# Patient Record
Sex: Female | Born: 1990 | Race: White | Hispanic: No | Marital: Single | Smoking: Former smoker
Health system: Southern US, Community
[De-identification: ages and names within clinical notes are randomized; demographics above are authoritative.]

## PROBLEM LIST (undated history)

## (undated) DIAGNOSIS — F32A Depression, unspecified: Secondary | ICD-10-CM

## (undated) DIAGNOSIS — F329 Major depressive disorder, single episode, unspecified: Secondary | ICD-10-CM

## (undated) HISTORY — DX: Depression, unspecified: F32.A

## (undated) HISTORY — DX: Major depressive disorder, single episode, unspecified: F32.9

---
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
## 2011-01-12 ENCOUNTER — Emergency Department: Payer: Self-pay | Admitting: Emergency Medicine

## 2011-10-27 ENCOUNTER — Ambulatory Visit: Payer: Self-pay | Admitting: Family Medicine

## 2012-03-09 ENCOUNTER — Emergency Department: Payer: Self-pay | Admitting: Emergency Medicine

## 2013-03-01 ENCOUNTER — Emergency Department: Payer: Self-pay | Admitting: Emergency Medicine

## 2013-03-01 LAB — BASIC METABOLIC PANEL
Anion Gap: 9 (ref 7–16)
BUN: 9 mg/dL (ref 7–18)
Chloride: 105 mmol/L (ref 98–107)
Creatinine: 0.49 mg/dL — ABNORMAL LOW (ref 0.60–1.30)
EGFR (African American): >60
EGFR (Non-African Amer.): >60
Glucose: 95 mg/dL (ref 65–99)
Potassium: 3.6 mmol/L (ref 3.5–5.1)

## 2013-03-01 LAB — CBC
HCT: 33.2 % — ABNORMAL LOW (ref 35.0–47.0)
HGB: 11.3 g/dL — ABNORMAL LOW (ref 12.0–16.0)
MCH: 30.6 pg (ref 26.0–34.0)
MCHC: 34 g/dL (ref 32.0–36.0)
MCV: 90 fL (ref 80–100)
RBC: 3.7 10*6/uL — ABNORMAL LOW (ref 3.80–5.20)

## 2013-03-02 LAB — URINALYSIS, COMPLETE
Bilirubin,UR: NEGATIVE
Glucose,UR: NEGATIVE mg/dL (ref 0–75)
Nitrite: NEGATIVE
Ph: 5 (ref 4.5–8.0)
Protein: NEGATIVE
RBC,UR: 2 /HPF (ref 0–5)

## 2013-04-08 ENCOUNTER — Ambulatory Visit: Payer: Self-pay | Admitting: Family Medicine

## 2013-05-09 ENCOUNTER — Encounter: Payer: Self-pay | Admitting: Maternal & Fetal Medicine

## 2013-08-22 ENCOUNTER — Observation Stay: Payer: Self-pay

## 2013-08-22 ENCOUNTER — Inpatient Hospital Stay: Payer: Self-pay

## 2013-08-22 LAB — CBC WITH DIFFERENTIAL/PLATELET
Basophil #: 0.1 10*3/uL (ref 0.0–0.1)
Basophil %: 0.4 %
Eosinophil #: 0.1 10*3/uL (ref 0.0–0.7)
Eosinophil %: 0.4 %
HCT: 35 % (ref 35.0–47.0)
Lymphocyte #: 1.3 10*3/uL (ref 1.0–3.6)
MCV: 94 fL (ref 80–100)
Monocyte #: 1.1 x10 3/mm — ABNORMAL HIGH (ref 0.2–0.9)
Monocyte %: 7.5 %
Neutrophil %: 82.4 %
Platelet: 164 10*3/uL (ref 150–440)
RBC: 3.72 10*6/uL — ABNORMAL LOW (ref 3.80–5.20)
RDW: 13.8 % (ref 11.5–14.5)

## 2013-08-24 LAB — HEMATOCRIT: HCT: 32.3 % — ABNORMAL LOW (ref 35.0–47.0)

## 2013-08-24 LAB — GC/CHLAMYDIA PROBE AMP

## 2015-04-07 NOTE — H&P (Signed)
L&D Evaluation:  History:  HPI Pt is a 24 yo G1P0 at 41 weeks who presents to the hospital today with reports of ctx. Pt reports +FM, denies vb, lof. She is O+, RNI, VNI, GBS negative. She had her TDaP during this pregnancy. Her cervical exam was 1-2/70 upon admission.   Presents with contractions   Patient's Medical History anemia   Patient's Surgical History none   Medications Pre Natal Vitamins  Iron   Allergies NKDA   Family History Non-Contributory   ROS:  ROS All systems were reviewed.  HEENT, CNS, GI, GU, Respiratory, CV, Renal and Musculoskeletal systems were found to be normal.   Exam:  Vital Signs stable   General no apparent distress   Mental Status clear   Chest clear   Heart normal sinus rhythm   Abdomen gravid, tender with contractions   Back no CVAT   Edema no edema   Mebranes Intact   FHT normal rate with no decels   Ucx irregular   Skin dry, no lesions   Other Cervical exam at 920am 2/70. Pt has not changed since admission.   Impression:  Impression reactive NST, possible early labor, post dates   Plan:  Plan discharge, pt scheduled for induction tonight. Pt sent home with therapeutic rest and instructions of when to come back to the hospital.   Follow Up Appointment need to schedule   Electronic Signatures: Jannet MantisSubudhi, Nanie Dunkleberger (CNM)  (Signed 25-Sep-14 09:48)  Authored: L&D Evaluation   Last Updated: 25-Sep-14 09:48 by Jannet MantisSubudhi, Baird Polinski (CNM)

## 2016-01-28 ENCOUNTER — Encounter: Payer: Self-pay | Admitting: Physical Therapy

## 2016-01-28 ENCOUNTER — Ambulatory Visit: Payer: Medicaid Other | Attending: Nurse Practitioner | Admitting: Physical Therapy

## 2016-01-28 DIAGNOSIS — N8189 Other female genital prolapse: Secondary | ICD-10-CM | POA: Diagnosis present

## 2016-01-28 DIAGNOSIS — N8184 Pelvic muscle wasting: Secondary | ICD-10-CM | POA: Insufficient documentation

## 2016-01-28 DIAGNOSIS — R279 Unspecified lack of coordination: Secondary | ICD-10-CM | POA: Diagnosis present

## 2016-01-28 DIAGNOSIS — M6289 Other specified disorders of muscle: Secondary | ICD-10-CM

## 2016-01-28 NOTE — Patient Instructions (Signed)
You are now ready to begin training the deep core muscles system: diaphragm, transverse abdominis, pelvic floor . These muscles must work together as a team.           The key to these exercises to train the brain to coordinate the timing of these muscles and to have them turn on for long periods of time to hold you upright against gravity (especially important if you are on your feet all day).These muscles are postural muscles and play a role stabilizing your spine and bodyweight. By doing these repetitions slowly and correctly instead of doing crunches, you will achieve a flatter belly without a lower pooch. You are also placing your spine in a more neutral position and breathing properly which in turn, decreases your risk for problems related to your pelvic floor, abdominal, and low back such as pelvic organ prolapse, hernias, diastasis recti (separation of superficial muscles), disk herniations, spinal fractures. These exercises set a solid foundation for you to later progress to resistance/ strength training with therabands and weights and return to other typical fitness exercises with a stronger deeper core.      PELVIC FLOOR / KEGEL EXERCISES   Pelvic floor/ Kegel exercises are used to strengthen the muscles in the base of your pelvis that are responsible for supporting your pelvic organs and preventing urine/feces leakage. Based on your therapist's recommendations, they can be performed while standing, sitting, or lying down. Imagine pelvic floor area as a diamond with pelvic landmarks: top =pubic bone, bottom tip=tailbone, sides=sitting bones (ischial tuberosities).    Make yourself aware of this muscle group by using these cues while coordinating your breath:  Inhale, feel pelvic floor diamond area lower like hammock towards your feet and ribcage/belly expanding. Pause. Let the exhale naturally and feel your belly sink, abdominal muscles hugging in around you and you may notice the  pelvic diamond draws upward towards your head forming a umbrella shape. Give a squeeze during the exhalation like you are stopping the flow of urine. If you are squeezing the buttock muscles, try to give 50% less effort.   Common Errors:  Breath holding: If you are holding your breath, you may be bearing down against your bladder instead of pulling it up. If you belly bulges up while you are squeezing, you are holding your breath. Be sure to breathe gently in and out while exercising. Counting out loud may help you avoid holding your breath.  Accessory muscle use: You should not see or feel other muscle movement when performing pelvic floor exercises. When done properly, no one can tell that you are performing the exercises. Keep the buttocks, belly and inner thighs relaxed.  Overdoing it: Your muscles can fatigue and stop working for you if you over-exercise. You may actually leak more or feel soreness at the lower abdomen or rectum.  YOUR HOME EXERCISE PROGRAM  LONG HOLDS: Position: on back  Inhale and then exhale. Then squeeze the muscle and count aloud for 10 seconds. Rest with three long breaths. (Be sure to let belly sink in with exhales and not push outward)  Perform 3 repetitions, 3 times/day  SHORT HOLDS: Position: on back, sitting  Inhale and then exhale. Then squeeze the muscle.  (Be sure to let belly sink in with exhales and not push outward)  Perform 5 repetitions, 5  Times/day                      DECREASE DOWNWARD PRESSURE ON  YOUR PELVIC FLOOR, ABDOMINAL, LOW BACK MUSCLES       PRESERVE YOUR PELVIC HEALTH LONG-TERM   ** SQUEEZE pelvic floor BEFORE YOUR SNEEZE, COUGH, LAUGH   ** EXHALE BEFORE YOU RISE AGAINST GRAVITY (lifting, sit to stand, from squat to stand)   ** LOG ROLL OUT OF BED INSTEAD OF CRUNCH/SIT-UP    ____________________________  [email or web address]

## 2016-01-29 NOTE — Therapy (Signed)
[facility] MAIN [facility] SERVICES [address] [place], [place], [address] Phone: [phone]   Fax:  [phone]  Physical Therapy Evaluation  Patient Details  Name: Martha Hammond MRN: [id number] Date of Birth: January 22, 1991 Referring Provider: Lilian Kapur , NP  Encounter Date: 01/28/2016      PT End of Session - 01/29/16 2217    Visit Number 1   Number of Visits 1   PT Start Time 1300   PT Stop Time 1355   PT Time Calculation (min) 55 min      Past Medical History  Diagnosis Date  . Depression     History reviewed. No pertinent past surgical history.  There were no vitals filed for this visit.  Visit Diagnosis:  Pelvic floor dysfunction  Pelvic floor weakness  Lack of coordination      Subjective Assessment - 01/29/16 2231    Subjective Pt reported SUI with laughing, coughing, and running. Pt states she feels her bladder feels full. Pt reports urinary frequency 2-3x/ hr and drinks sweet tea 12 cups/ day and no water.  Daily bowel movements. Pain with insertions of tampons. Pt tried performing kegels but had difficulty squeezing. Pt denied perineal /abdominal injuries/ surgeries.     Patient Stated Goals get pelvic floor muscles normal and body healthier             [facility] PT Assessment - 01/29/16 2213    Assessment   Medical Diagnosis SUI   Referring Provider Lilian Kapur , NP   Precautions   Precautions None   Restrictions   Weight Bearing Restrictions No   Balance Screen   Has the patient fallen in the past 6 months No   Coordination   Gross Motor Movements are Fluid and Coordinated --  chest breathing    Fine Motor Movements are Fluid and Coordinated --  abdominal/chest accessory use w/ pelvic floor contraction   Posture/Postural Control   Posture Comments lumbopelvic instability w/ ASLR                  Pelvic Floor Special Questions - 01/29/16 2216    Pelvic Floor Internal Exam pt consented  verbally without contraindications    Exam Type Vaginal   Strength fair squeeze, definite lift  initally 2/5    Strength # of reps 3   Strength # of seconds 10   Biofeedback cued for diaphragmatic breathing to elicit circumferential contraction with all 3 layers                    PT Short Term Goals - 01/29/16 2223    PT SHORT TERM GOAL #1   Title Pt will demo diaphragmatic breathing to increase pelvic floor lengthening/ strengthening  in order to promote urinary mechanics fo urination.     Time 1   Period Days   Status Achieved   PT SHORT TERM GOAL #2   Title Pt will be able to perform deep core level 1-2 (5 reps) in order to progress to core exercises with minimize risk for injuries.    Time 1   Period Days   Status Achieved   PT SHORT TERM GOAL #3   Title Pt will voice understanding to decrease bladder irritants to water intake to decrease urinary frequency/ urgency.    Time 1   Period Days   Status Achieved   PT SHORT TERM GOAL #4   Title Pt will demo pelvic floor contraction with coughing cue  and with sit to stand and lifting in order to minimize SUI and improve QOL.   Time 1   Period Days   Status Achieved                  Plan - 01/29/16 2218    Clinical Impression Statement Pt is a 25 yo w/ c/o SUI and urinary frequency/urgency which impact her QOL. Pt 's clinical presentations include poor coordination and strength of deep core mm, pelvic floor weakness, and poor education on  bladder health with increased intake of bladder irritants. Pt 's personal factors include one vaginal delivery, intake of excessive bladder irritants and minimal water, and  participation in core exercises that place strain on her pelvic floor mm. Due to Medicaid coverage and limited finances, pt is limited to this visit only. Pt achieved 100% of her goals with demonstration of improved pelvic floor strength / coordination. Pt will be d/c at this time.  Pt was provided  education on managing her Sx and information on [facility] for pro bono PT services.     Pt will benefit from skilled therapeutic intervention in order to improve on the following deficits Decreased safety awareness;Postural dysfunction;Improper body mechanics;Decreased activity tolerance;Decreased endurance;Decreased strength;Decreased coordination;Decreased mobility   Rehab Potential Good   PT Frequency One time visit  due to medicaid coverage for 1 visit    Recommended Other Services [facility] for Pro-bono services , and book resources for exercises    Consulted and Agree with Plan of Care Patient         Problem List There are no active problems to display for this patient.   Mariane MastersYeung,Shin Yiing ,PT, DPT, E-RYT  01/29/2016, 10:31 PM  [facility] [address] [place], [place], [address] Phone: [phone]   Fax:  [phone]  Name: Martha Hammond MRN: [id number] Date of Birth: 05/18/1991

## 2018-04-05 ENCOUNTER — Encounter: Payer: Self-pay | Admitting: Emergency Medicine

## 2018-04-05 ENCOUNTER — Emergency Department: Payer: Self-pay

## 2018-04-05 ENCOUNTER — Emergency Department
Admission: EM | Admit: 2018-04-05 | Discharge: 2018-04-05 | Disposition: A | Discharge status: Home / Self Care | Payer: Self-pay | Attending: Emergency Medicine | Admitting: Emergency Medicine

## 2018-04-05 ENCOUNTER — Other Ambulatory Visit: Payer: Self-pay

## 2018-04-05 DIAGNOSIS — M25562 Pain in left knee: Secondary | ICD-10-CM | POA: Insufficient documentation

## 2018-04-05 DIAGNOSIS — Z87891 Personal history of nicotine dependence: Secondary | ICD-10-CM | POA: Insufficient documentation

## 2018-04-05 DIAGNOSIS — M25462 Effusion, left knee: Secondary | ICD-10-CM | POA: Insufficient documentation

## 2018-04-05 MED ORDER — MELOXICAM 15 MG PO TABS: 15.0000 mg | ORAL_TABLET | Freq: Every day | ORAL | 0 refills | Status: AC

## 2018-04-05 MED ORDER — MELOXICAM 7.5 MG PO TABS
15.0000 mg | ORAL_TABLET | Freq: Once | ORAL | Status: AC
  Administered 2018-04-05: 15 mg via ORAL
  Filled 2018-04-05: qty 2

## 2018-04-05 NOTE — ED Provider Notes (Signed)
[facility] Emergency Department Provider Note  ____________________________________________  Time seen: Approximately 6:40 PM  I have reviewed the triage vital signs and the nursing notes.   HISTORY  Chief Complaint Knee Pain    HPI Martha Hammond is a 27 y.o. female who presents the emergency department complaining of left knee pain, swelling.  Patient reports that she has not experienced any trauma to the left knee to her knowledge.  She reports that overnight her knee began to swell.  She denies any erythema, warmth to palpation.  Patient reports that the pain is sharp with ambulation or movement.  No history of same.  Again, no trauma penetrating, direct, or twisting in nature are reported.  No systemic complaints.  No recent illnesses.  No history of gout.  No treatments for this complaint prior to arrival.  No other complaints.  Past Medical History:  Diagnosis Date  . Depression     There are no active problems to display for this patient.   History reviewed. No pertinent surgical history.  Prior to Admission medications   Medication Sig Start Date End Date Taking? Authorizing Provider  meloxicam (MOBIC) 15 MG tablet Take 1 tablet (15 mg total) by mouth daily. 04/05/18   Cuthriell, Delorise Royals, PA-C    Allergies Patient has no known allergies.  No family history on file.  Social History Social History   Tobacco Use  . Smoking status: Former Smoker    Last attempt to quit: 01/27/2013    Years since quitting: 5.1  . Smokeless tobacco: Never Used  Substance Use Topics  . Alcohol use: No  . Drug use: No     Review of Systems  Constitutional: No fever/chills Eyes: No visual changes.  Cardiovascular: no chest pain. Respiratory: no cough. No SOB. Gastrointestinal: No abdominal pain.  No nausea, no vomiting.   Musculoskeletal: Positive for nontraumatic left knee pain and swelling Skin: Negative for rash, abrasions, lacerations,  ecchymosis. Neurological: Negative for headaches, focal weakness or numbness. 10-point ROS otherwise negative.  ____________________________________________   PHYSICAL EXAM:  VITAL SIGNS: ED Triage Vitals  Enc Vitals Group     BP 04/05/18 1821 138/74     Pulse Rate 04/05/18 1821 (!) 110     Resp 04/05/18 1821 18     Temp 04/05/18 1821 (!) 97.5 F (36.4 C)     Temp Source 04/05/18 1821 Oral     SpO2 04/05/18 1821 98 %     Weight --      Height --      Head Circumference --      Peak Flow --      Pain Score 04/05/18 1829 10     Pain Loc --      Pain Edu? --      Excl. in GC? --      Constitutional: Alert and oriented. Well appearing and in no acute distress. Eyes: Conjunctivae are normal. PERRL. EOMI. Head: Atraumatic. Neck: No stridor.    Cardiovascular: Normal rate, regular rhythm. Normal S1 and S2.  Good peripheral circulation. Respiratory: Normal respiratory effort without tachypnea or retractions. Lungs CTAB. Good air entry to the bases with no decreased or absent breath sounds. Musculoskeletal: Full range of motion to all extremities. No gross deformities appreciated.  Mild edema of the left knee is appreciated with no erythema.  Patient has limited range of motion due to pain.  Palpation reveals diffuse tenderness to palpation globally over the knee with no specific point tenderness.  No palpable abnormality.  Positive for ballottement in the suprapatellar region.  Examination of the left hip and ankle is unremarkable.  Dorsalis pedis pulse intact distally.  Sensation intact distally. Neurologic:  Normal speech and language. No gross focal neurologic deficits are appreciated.  Skin:  Skin is warm, dry and intact. No rash noted. Psychiatric: Mood and affect are normal. Speech and behavior are normal. Patient exhibits appropriate insight and judgement.   ____________________________________________   LABS (all labs ordered are listed, but only abnormal results are  displayed)  Labs Reviewed - No data to display ____________________________________________  EKG   ____________________________________________  RADIOLOGY Festus Barren Cuthriell, personally viewed and evaluated these images (plain radiographs) as part of my medical decision making, as well as reviewing the written report by the radiologist.  Dg Knee Complete 4 Views Left  Result Date: 04/05/2018 CLINICAL DATA:  Left knee pain and swelling EXAM: LEFT KNEE - COMPLETE 4+ VIEW COMPARISON:  None. FINDINGS: No fracture or dislocation. No arthropathy. Small suprapatellar effusion. No radiopaque foreign body. IMPRESSION: Small suprapatellar effusion without fracture or dislocation. Electronically Signed   By: Deatra Robinson M.D.   On: 04/05/2018 19:13    ____________________________________________    PROCEDURES  Procedure(s) performed:    Procedures    Medications  meloxicam (MOBIC) tablet 15 mg (has no administration in time range)     ____________________________________________   INITIAL IMPRESSION / ASSESSMENT AND PLAN / ED COURSE  Pertinent labs & imaging results that were available during my care of the patient were reviewed by me and considered in my medical decision making (see chart for details).  Review of the [place] CSRS was performed in accordance of the [facility] prior to dispensing any controlled drugs.     Patient's diagnosis is consistent with left knee pain with suprapatellar joint effusion.  Patient presents with nontraumatic left knee pain and swelling.  X-ray reveals small suprapatellar joint effusion consistent with exam findings.  Differential included strain, fracture, dislocation, gout, septic arthritis, reactive arthritis.  On exam, clinical suspicion for gout, reactive for septic arthritis is extremely low.  I discussed options of treatment with patient to include arthrocentesis for synovial fluid analysis.  Patient adamantly declines at this time.  Patient's  knee will be immobilized, she will be placed on anti-inflammatories.  If symptoms worsen, patient is to follow-up with the emergency department, primary care, or orthopedics as needed.. Patient will be discharged home with prescriptions for Floxin cam. Patient is to follow up with primary care or orthopedics as needed or otherwise directed. Patient is given ED precautions to return to the ED for any worsening or new symptoms.     ____________________________________________  FINAL CLINICAL IMPRESSION(S) / ED DIAGNOSES  Final diagnoses:  Acute pain of left knee  Effusion of left knee      NEW MEDICATIONS STARTED DURING THIS VISIT:  ED Discharge Orders        Ordered    meloxicam (MOBIC) 15 MG tablet  Daily     04/05/18 1925          This chart was dictated using voice recognition software/Dragon. Despite best efforts to proofread, errors can occur which can change the meaning. Any change was purely unintentional.    Racheal Patches, PA-C 04/05/18 Alain Honey, MD 04/06/18 [phone]

## 2018-04-05 NOTE — ED Triage Notes (Signed)
Presents with left knee pain since yesterday  States she just woke up with pain and swelling to left knee  Denies any injury  Unable to bear wt

## 2019-10-11 IMAGING — DX DG KNEE COMPLETE 4+V*L*
4 series · 4 of 4 positions shown · non-contrast
Comparison: None.

CLINICAL DATA: Left knee pain and swelling

EXAM:
LEFT KNEE - COMPLETE 4+ VIEW

[knee ap]
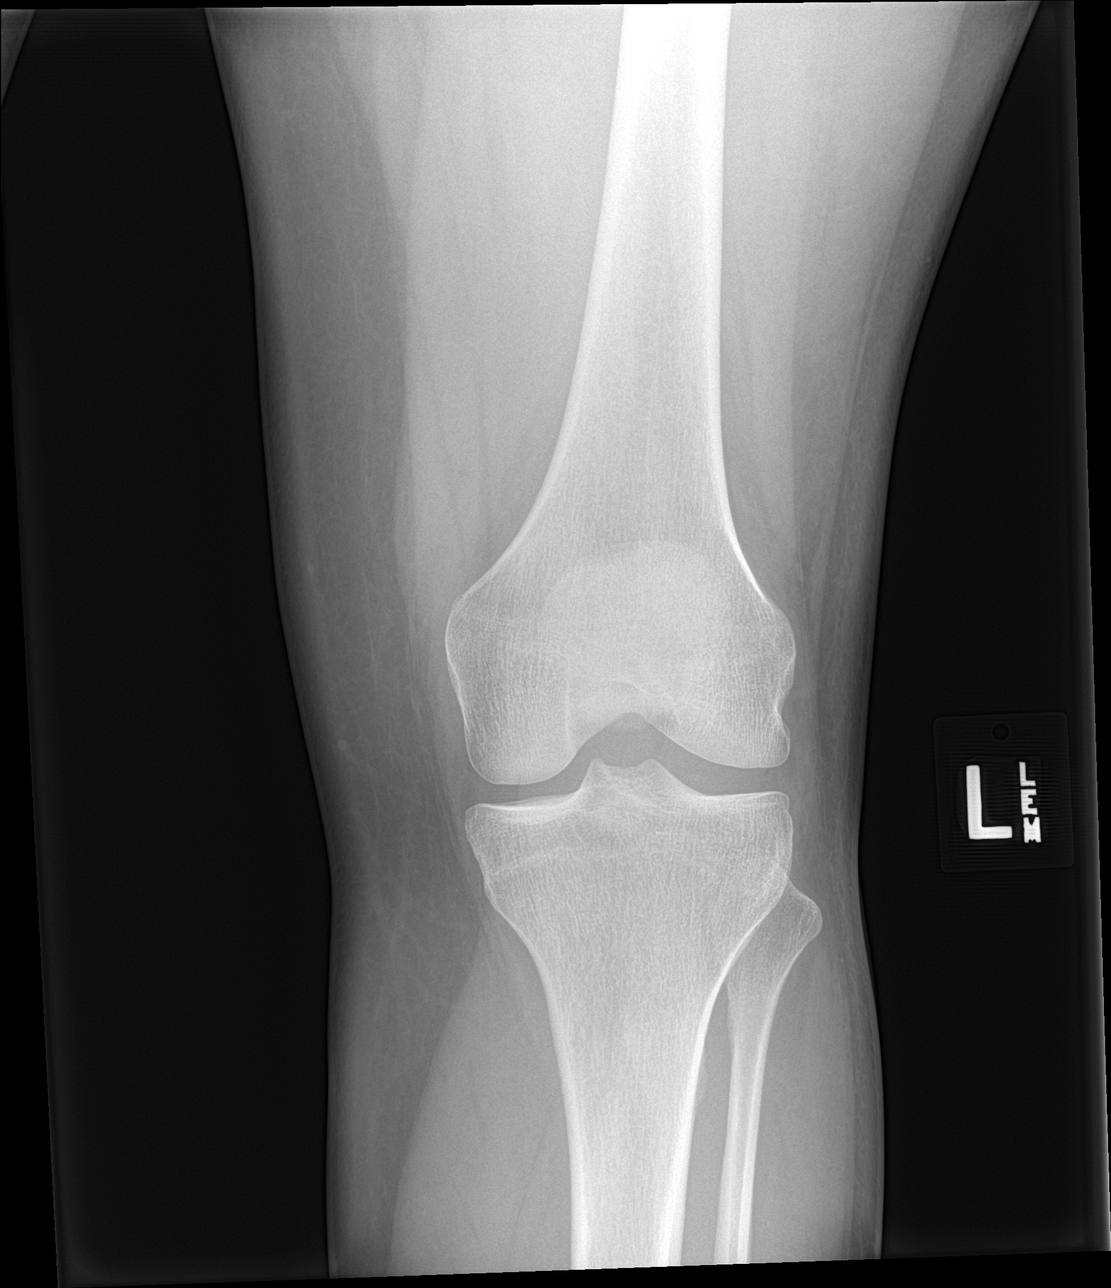

[knee lat]
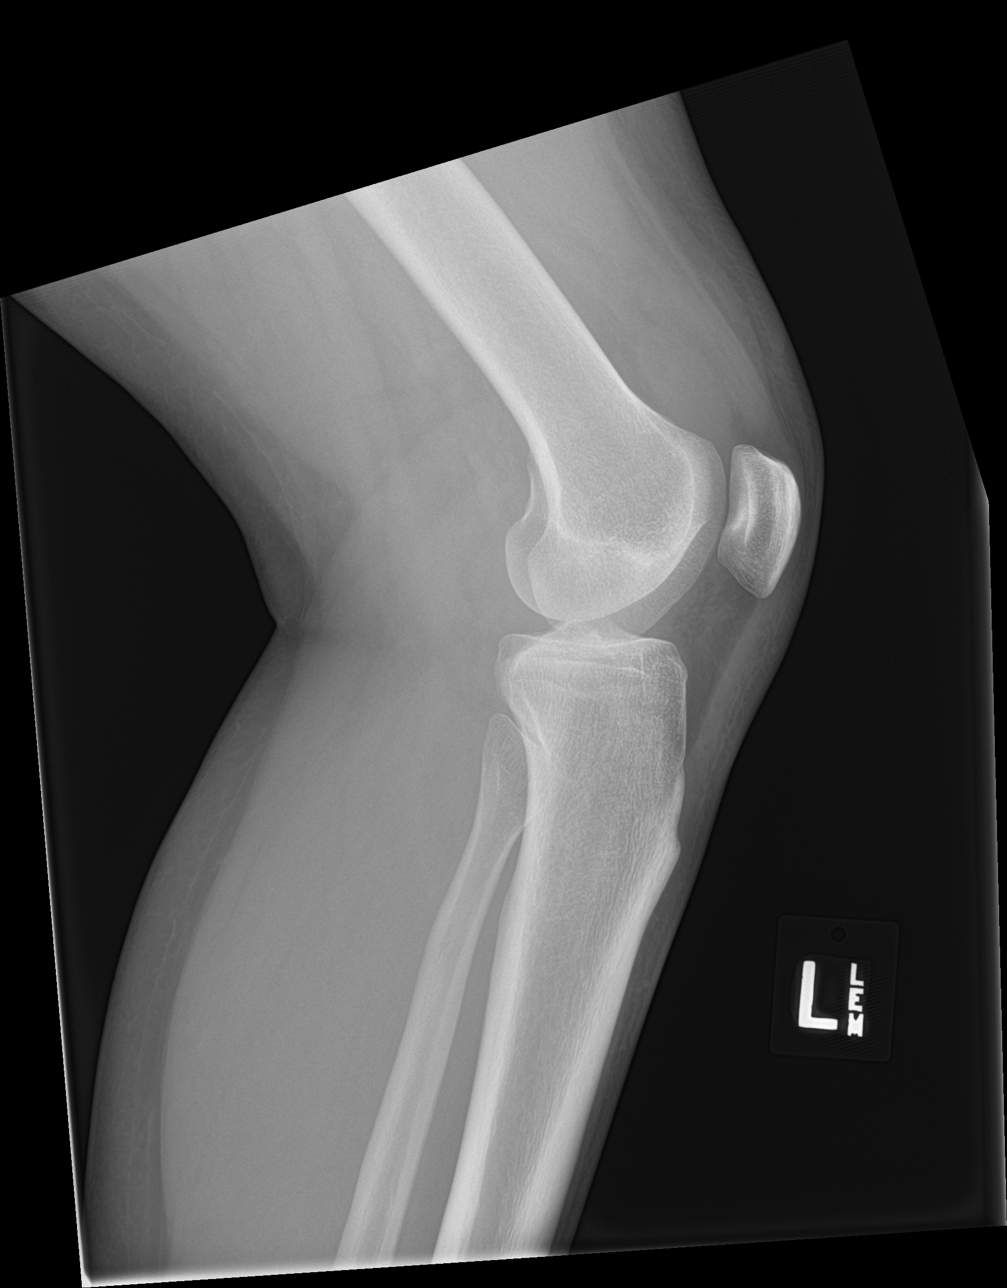

[knee obl (1 of 2)]
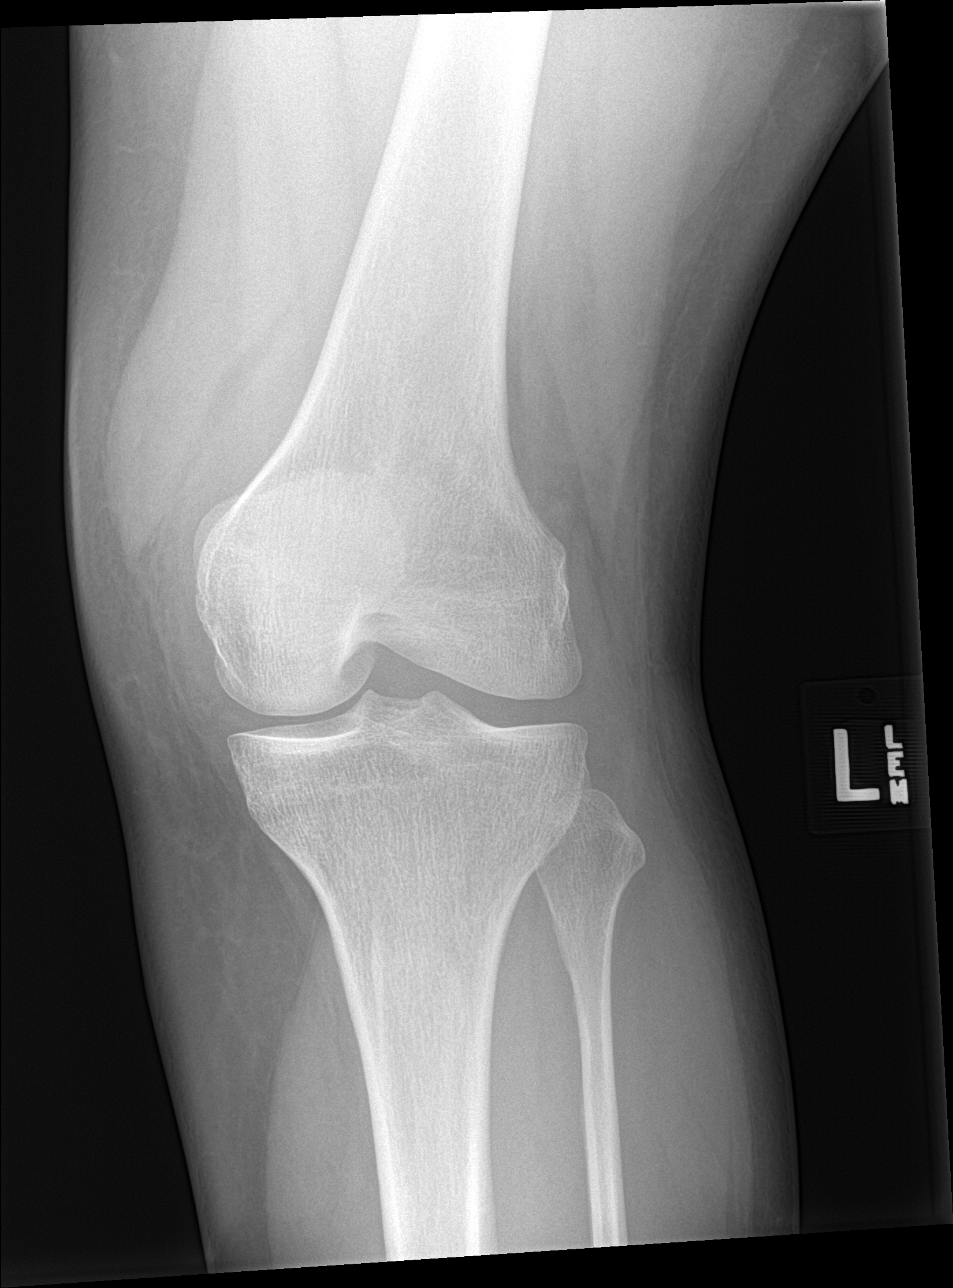

[knee obl (2 of 2)]
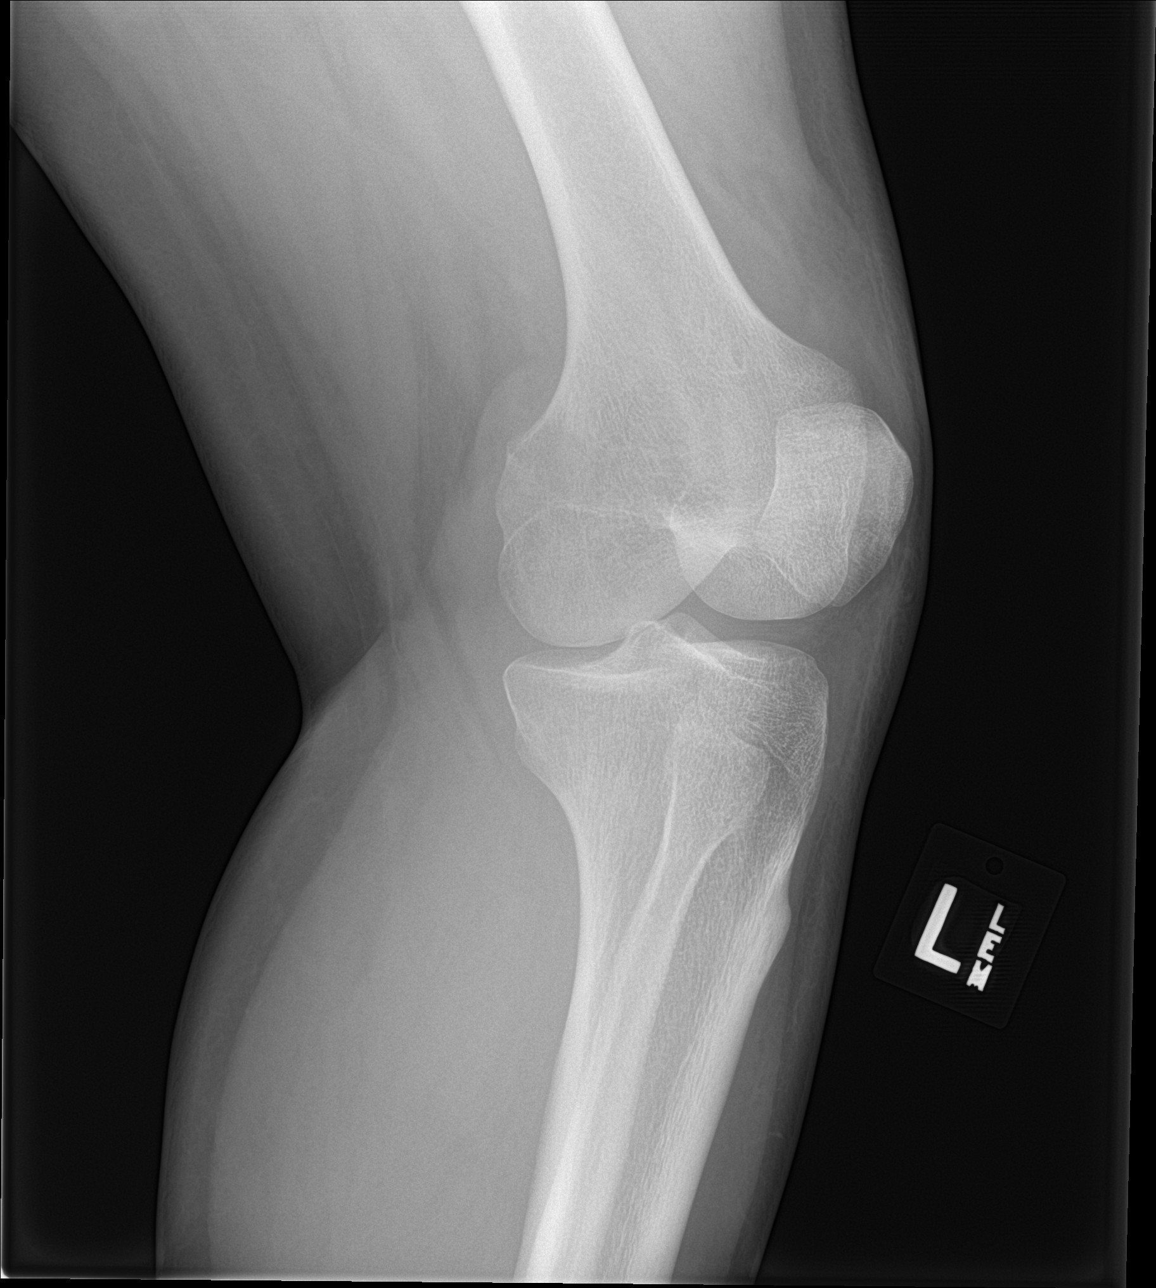

[4 of 4 positions shown; findings below may reference images not displayed]

FINDINGS: No fracture or dislocation. No arthropathy. Small suprapatellar
effusion. No radiopaque foreign body.
IMPRESSION: Small suprapatellar effusion without fracture or dislocation.
